# Patient Record
Sex: Male | Born: 1985 | Race: Black or African American | Hispanic: No | Marital: Single | Smoking: Current some day smoker
Health system: Southern US, Community
[De-identification: ages and names within clinical notes are randomized; demographics above are authoritative.]

---

## 2012-09-16 ENCOUNTER — Emergency Department (HOSPITAL_BASED_OUTPATIENT_CLINIC_OR_DEPARTMENT_OTHER): Payer: Self-pay

## 2012-09-16 ENCOUNTER — Encounter (HOSPITAL_BASED_OUTPATIENT_CLINIC_OR_DEPARTMENT_OTHER): Payer: Self-pay

## 2012-09-16 ENCOUNTER — Emergency Department (HOSPITAL_BASED_OUTPATIENT_CLINIC_OR_DEPARTMENT_OTHER)
Admission: EM | Admit: 2012-09-16 | Discharge: 2012-09-16 | Disposition: A | Discharge status: Home / Self Care | Payer: Self-pay | Attending: Emergency Medicine | Admitting: Emergency Medicine

## 2012-09-16 DIAGNOSIS — S82892A Other fracture of left lower leg, initial encounter for closed fracture: Secondary | ICD-10-CM

## 2012-09-16 DIAGNOSIS — Y92009 Unspecified place in unspecified non-institutional (private) residence as the place of occurrence of the external cause: Secondary | ICD-10-CM | POA: Insufficient documentation

## 2012-09-16 DIAGNOSIS — Y9339 Activity, other involving climbing, rappelling and jumping off: Secondary | ICD-10-CM | POA: Insufficient documentation

## 2012-09-16 DIAGNOSIS — S92009A Unspecified fracture of unspecified calcaneus, initial encounter for closed fracture: Secondary | ICD-10-CM | POA: Insufficient documentation

## 2012-09-16 DIAGNOSIS — S93409A Sprain of unspecified ligament of unspecified ankle, initial encounter: Secondary | ICD-10-CM | POA: Insufficient documentation

## 2012-09-16 DIAGNOSIS — S92109A Unspecified fracture of unspecified talus, initial encounter for closed fracture: Secondary | ICD-10-CM | POA: Insufficient documentation

## 2012-09-16 DIAGNOSIS — F17200 Nicotine dependence, unspecified, uncomplicated: Secondary | ICD-10-CM | POA: Insufficient documentation

## 2012-09-16 DIAGNOSIS — X500XXA Overexertion from strenuous movement or load, initial encounter: Secondary | ICD-10-CM | POA: Insufficient documentation

## 2012-09-16 MED ORDER — HYDROCODONE-ACETAMINOPHEN 5-325 MG PO TABS: 2.0000 | ORAL_TABLET | ORAL | Status: DC | PRN

## 2012-09-16 MED ORDER — IBUPROFEN 800 MG PO TABS: 800.0000 mg | ORAL_TABLET | Freq: Three times a day (TID) | ORAL | Status: DC

## 2012-09-16 NOTE — ED Provider Notes (Signed)
History    CSN: [id number] Arrival date & time 09/16/12  0818  First MD Initiated Contact with Patient 09/16/12 [phone]     Chief Complaint  Patient presents with  . Ankle Injury   (Consider location/radiation/quality/duration/timing/severity/associated sxs/prior Treatment) HPI Comments: Patient presents with left ankle pain. He states he locked himself out of the house and was climbing in the window and when he stepped down through the window he twisted his left foot. He complains of pain primarily in the lateral aspect of the left foot. He denies any injuries. He denies any past significant injuries to his foot. He has constant throbbing pain which has been worsening through the night. He has not taken anything for the pain at home.  Patient is a 27 y.o. male presenting with lower extremity injury.  Ankle Injury Pertinent negatives include no headaches.   History reviewed. No pertinent past medical history. History reviewed. No pertinent past surgical history. No family history on file. History  Substance Use Topics  . Smoking status: Current Some Day Smoker    Types: Cigarettes  . Smokeless tobacco: Not on file  . Alcohol Use: Yes    Review of Systems  Constitutional: Negative for fever.  HENT: Negative for neck pain.   Respiratory: Negative.   Cardiovascular: Negative.   Gastrointestinal: Negative for nausea and vomiting.  Musculoskeletal: Positive for joint swelling. Negative for back pain.  Skin: Negative for wound.  Neurological: Negative for headaches.    Allergies  Review of patient's allergies indicates no known allergies.  Home Medications   Current Outpatient Rx  Name  Route  Sig  Dispense  Refill  . HYDROcodone-acetaminophen (NORCO/VICODIN) 5-325 MG per tablet   Oral   Take 2 tablets by mouth every 4 (four) hours as needed for pain.   15 tablet   0   . ibuprofen (ADVIL,MOTRIN) 800 MG tablet   Oral   Take 1 tablet (800 mg total) by mouth 3 (three)  times daily.   21 tablet   0    BP 139/82  Pulse 71  Temp(Src) 98.4 F (36.9 C) (Oral)  Resp 16  Ht 6\' 1"  (1.854 m)  Wt 210 lb (95.255 kg)  BMI 27.71 kg/m2  SpO2 100% Physical Exam  Constitutional: He is oriented to person, place, and time. He appears well-developed and well-nourished.  HENT:  Head: Normocephalic and atraumatic.  Neck: Normal range of motion. Neck supple.  No pain to neck or back  Musculoskeletal: He exhibits edema and tenderness.  He has positive swelling over the fourth and fifth proximal metatarsals in the left foot. He has tenderness to these areas. He has no swelling over the lateral malleolus but he does have some mild tenderness to the medial malleolus. There is no pain to the proximal fibula. He has normal sensation and motor function in the foot. Pulses in the foot are intact.  Neurological: He is alert and oriented to person, place, and time.    ED Course  Procedures (including critical care time) No results found for this or any previous visit. Dg Ankle Complete Left  09/16/2012   *RADIOLOGY REPORT*  Clinical Data: Pain and swelling secondary to a twisting injury.  LEFT ANKLE COMPLETE - 3+ VIEW  Comparison: None.  Findings: There is a tiny avulsion of bone from the lateral aspect of the calcaneus.  There is overlying soft tissue swelling and there is also a small avulsion from the dorsal aspect of the distal talus.  No ankle  joint effusion.  IMPRESSION: Small avulsions from the dorsal distal talus and from the lateral aspect of the calcaneus.   Original Report Authenticated By: Francene Boyers, M.D.   Dg Foot Complete Left  09/16/2012   '*RADIOLOGY REPORT*  Clinical Data: Ankle injury, pain/swelling  LEFT FOOT - COMPLETE 3+ VIEW  Comparison: None.  Findings: Possible tiny osseous density along the lateral aspect of the cuboid/calcaneus.  Correlate for point tenderness to exclude a tiny avulsion fracture.  Otherwise, no fracture is seen.  The joint spaces are  preserved.  The visualized soft tissues are unremarkable.  IMPRESSION: Possible tiny osseous density along the lateral aspect of the cuboid/calcaneus.  Correlate for point tenderness to exclude a tiny avulsion fracture.   Original Report Authenticated By: Charline Bills, M.D.      1. Ankle sprain and strain, left, initial encounter   2. Avulsion fracture of ankle, left, closed, initial encounter     MDM  Patient is instructed in [place]. He was given a prescription for Vicodin and ibuprofen. He was given an ankle splint and crutches to use. He was given a referral to follow with Dr. Pearletha Forge.  Rolan Bucco, MD 09/16/12 [phone]

## 2012-09-16 NOTE — ED Notes (Signed)
Returned from xray

## 2012-09-16 NOTE — ED Notes (Signed)
Patient transported to X-ray 

## 2012-09-16 NOTE — ED Notes (Signed)
Patient reports that he twisted left ankle and foot after climbing through window last pm. Swelling noted to top of foot and lateral aspect of ankle. No deformity noted, positive distal pulses

## 2014-06-13 IMAGING — CR DG FOOT COMPLETE 3+V*L*
3 series · 3 of 3 positions shown · non-contrast
Comparison: None.

'*RADIOLOGY REPORT*
CLINICAL DATA: Ankle injury, pain/swelling

LEFT FOOT - COMPLETE 3+ VIEW

[t foot ap left]
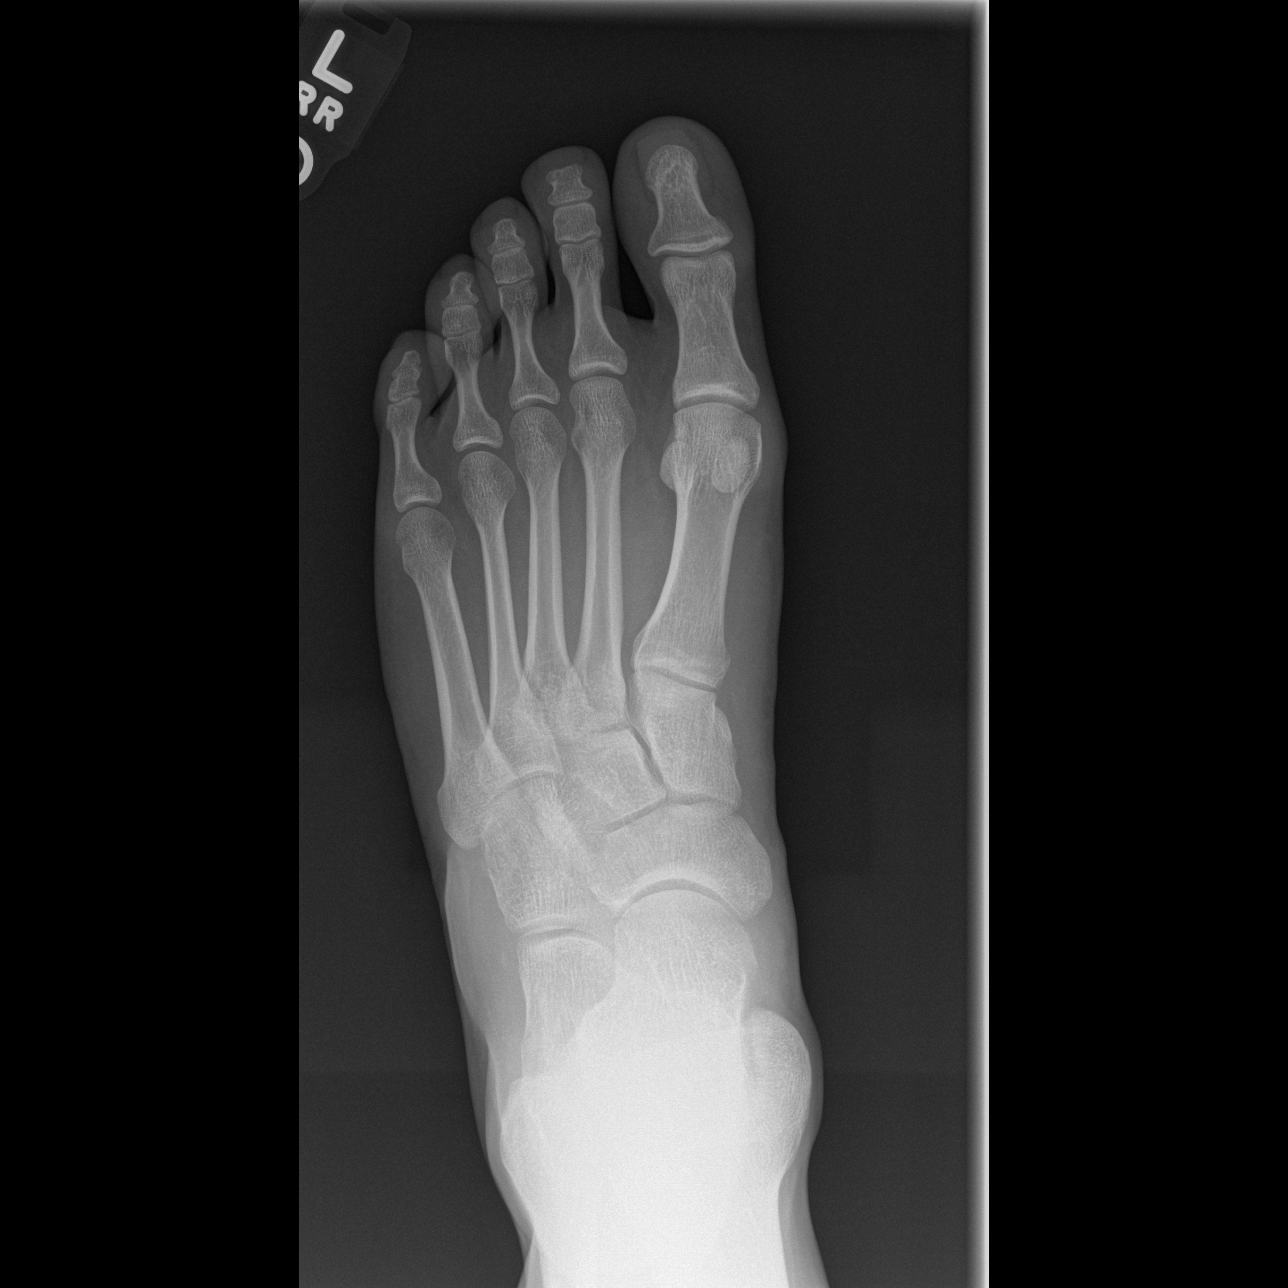

[t foot oblique left]
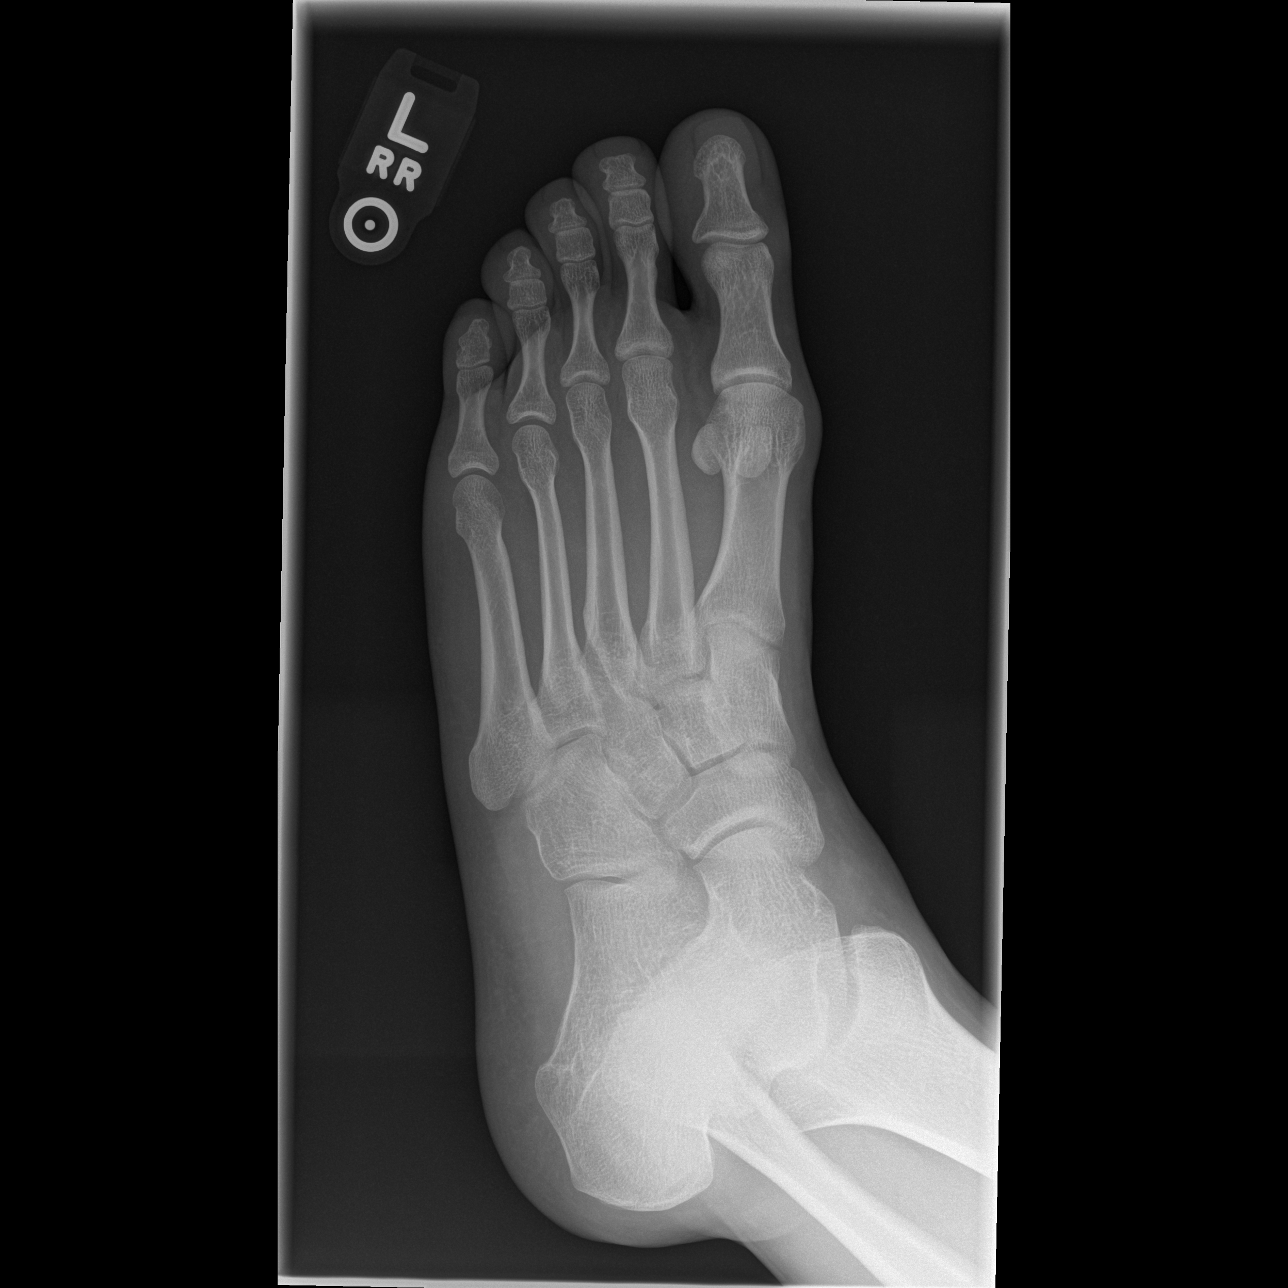

[t foot lat left]
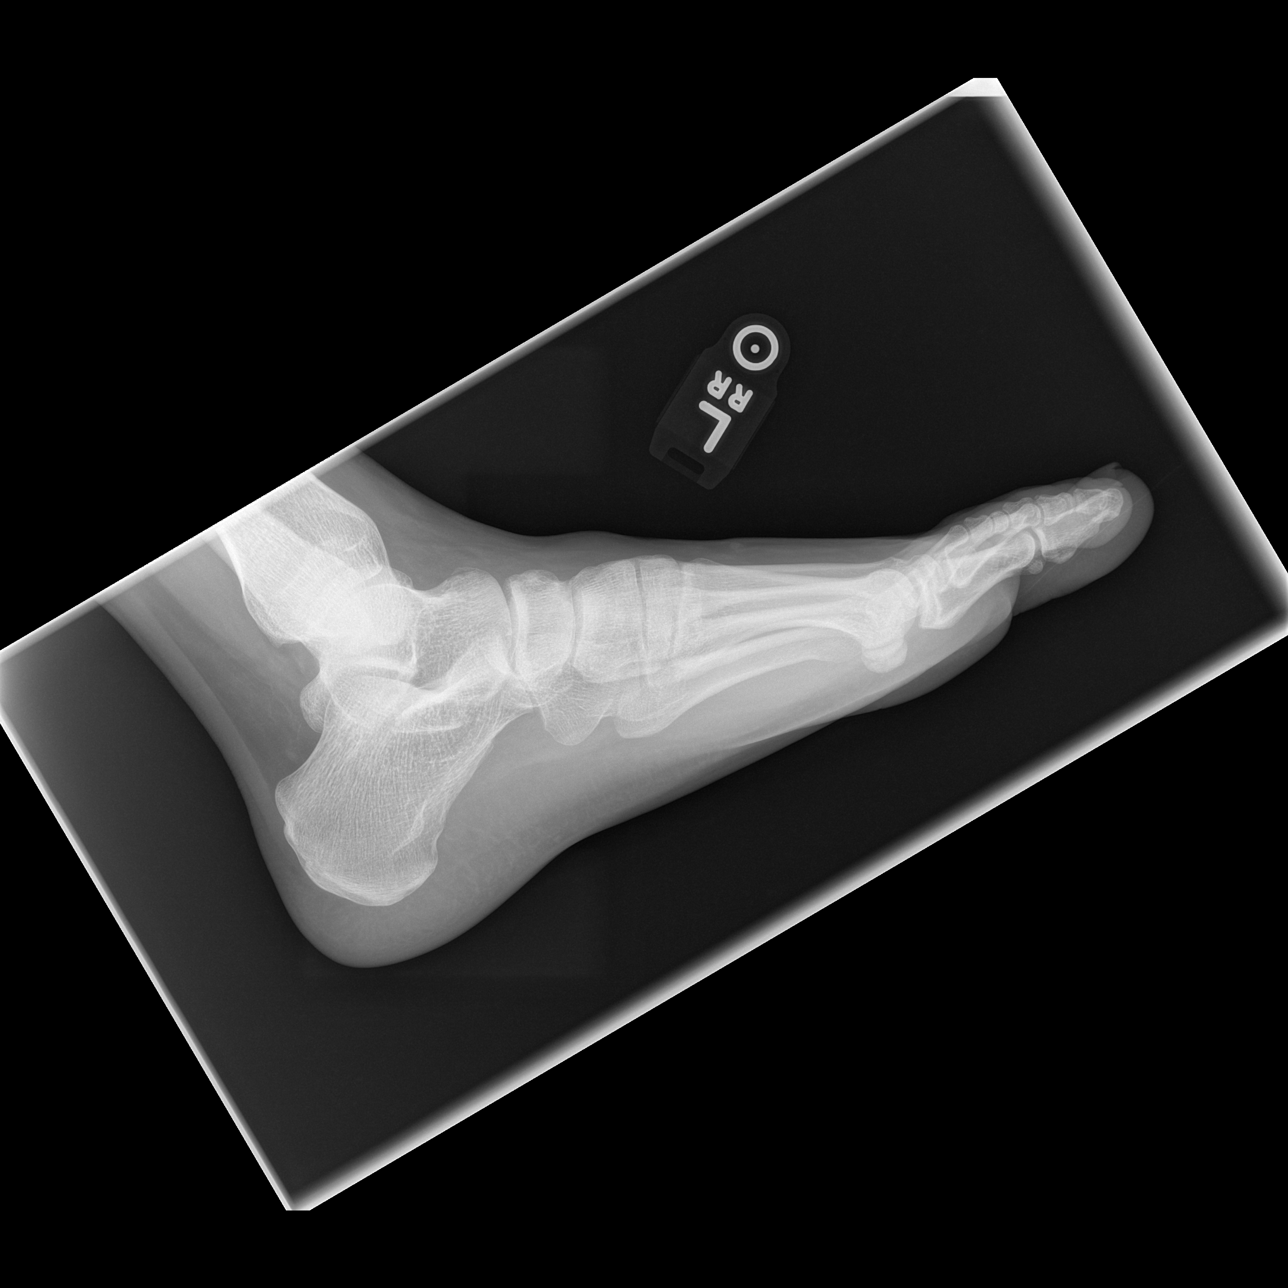

[3 of 3 positions shown; findings below may reference images not displayed]

FINDINGS: Possible tiny osseous density along the lateral aspect of
the cuboid/calcaneus.  Correlate for point tenderness to exclude a
tiny avulsion fracture.

Otherwise, no fracture is seen.

The joint spaces are preserved.

The visualized soft tissues are unremarkable.
IMPRESSION: Possible tiny osseous density along the lateral aspect of the
cuboid/calcaneus.  Correlate for point tenderness to exclude a tiny
avulsion fracture.

## 2015-01-29 ENCOUNTER — Encounter (HOSPITAL_COMMUNITY): Payer: Self-pay | Admitting: Emergency Medicine

## 2015-01-29 ENCOUNTER — Emergency Department (HOSPITAL_COMMUNITY)
Admission: EM | Admit: 2015-01-29 | Discharge: 2015-01-29 | Disposition: A | Discharge status: Home / Self Care | Payer: PRIVATE HEALTH INSURANCE | Attending: Emergency Medicine | Admitting: Emergency Medicine

## 2015-01-29 DIAGNOSIS — L03012 Cellulitis of left finger: Secondary | ICD-10-CM | POA: Insufficient documentation

## 2015-01-29 DIAGNOSIS — F17210 Nicotine dependence, cigarettes, uncomplicated: Secondary | ICD-10-CM | POA: Diagnosis not present

## 2015-01-29 DIAGNOSIS — R220 Localized swelling, mass and lump, head: Secondary | ICD-10-CM

## 2015-01-29 DIAGNOSIS — R600 Localized edema: Secondary | ICD-10-CM | POA: Insufficient documentation

## 2015-01-29 DIAGNOSIS — M79642 Pain in left hand: Secondary | ICD-10-CM | POA: Diagnosis present

## 2015-01-29 MED ORDER — CEPHALEXIN 500 MG PO CAPS: 500.0000 mg | ORAL_CAPSULE | Freq: Four times a day (QID) | ORAL | Status: AC

## 2015-01-29 MED ORDER — NAPROXEN 500 MG PO TABS: 500.0000 mg | ORAL_TABLET | Freq: Two times a day (BID) | ORAL | Status: AC

## 2015-01-29 MED ORDER — OXYCODONE-ACETAMINOPHEN 5-325 MG PO TABS
1.0000 | ORAL_TABLET | Freq: Once | ORAL | Status: AC
Start: 2015-01-29 — End: 2015-01-29
  Administered 2015-01-29: 1 via ORAL
  Filled 2015-01-29: qty 1

## 2015-01-29 NOTE — ED Notes (Signed)
Pt. presents with left distal thumb skin infection with pain and swelling onset last week , denies injury / no drainage .

## 2015-01-29 NOTE — ED Provider Notes (Signed)
CSN: [id number]     Arrival date & time 01/29/15  0008 History   First MD Initiated Contact with Patient 01/29/15 0047     Chief Complaint  Patient presents with  . Hand Pain     (Consider location/radiation/quality/duration/timing/severity/associated sxs/prior Treatment) Patient is a 29 y.o. male presenting with hand pain. The history is provided by the patient. No language interpreter was used.  Hand Pain This is a new problem. The current episode started in the past 7 days. The problem occurs constantly. The problem has been gradually worsening. Pertinent negatives include no fever. He has tried nothing for the symptoms.    History reviewed. No pertinent past medical history. History reviewed. No pertinent past surgical history. No family history on file. Social History  Substance Use Topics  . Smoking status: Current Some Day Smoker    Types: Cigarettes  . Smokeless tobacco: None  . Alcohol Use: Yes    Review of Systems  Constitutional: Negative for fever.  HENT: Positive for facial swelling.   Skin:       Swelling to cuticle left thumb  All other systems reviewed and are negative.     Allergies  Review of patient's allergies indicates no known allergies.  Home Medications   Prior to Admission medications   Not on File   BP 125/75 mmHg  Pulse 67  Temp(Src) 97.4 F (36.3 C) (Oral)  Resp 16  Ht 6\' 1"  (1.854 m)  Wt 190 lb (86.183 kg)  BMI 25.07 kg/m2  SpO2 97% Physical Exam  Constitutional: He is oriented to person, place, and time. He appears well-developed and well-nourished.  HENT:  Head: Normocephalic.  Area of induration without fluctuance left cheek  Eyes: Conjunctivae are normal.  Neck: Neck supple.  Cardiovascular: Normal rate and regular rhythm.   Pulmonary/Chest: Effort normal and breath sounds normal.  Abdominal: Soft.  Musculoskeletal: He exhibits tenderness. He exhibits no edema.       Hands: Paronychia dorsal, medial aspect left thumb    Lymphadenopathy:    He has no cervical adenopathy.  Neurological: He is alert and oriented to person, place, and time.  Skin: Skin is warm and dry.  Psychiatric: He has a normal mood and affect.  Nursing note and vitals reviewed.   ED Course  Procedures (including critical care time) INCISION AND DRAINAGE Performed by: Jimmye NormanSMITH,Timiya Howells JOHN Consent: Verbal consent obtained. Risks and benefits: risks, benefits and alternatives were discussed Type: paronychia  Body area: left thumb  Anesthesia: topical  Incision was made with an 18 ga needle  Anesthetic:pain-eaze  Anesthetic total: 10 second spray \ Complexity: simple  Drainage: purulent  Drainage amount: scant  Patient tolerance: Patient tolerated the procedure well with no immediate complications.  INCISION AND DRAINAGE Performed by: Jimmye NormanSMITH,Amardeep Beckers JOHN Consent: Verbal consent obtained. Risks and benefits: risks, benefits and alternatives were discussed Type: abscess  Body area: left side of face (cheek)  Anesthesia: topical  Needle aspiration  Local anesthetic: Pain-eaze  Anesthetic total: 10 second spray  Drainage: none  Patient tolerance: Patient tolerated the procedure well with no immediate complications.     Labs Review Labs Reviewed - No data to display  Imaging Review No results found. I have personally reviewed and evaluated these images and lab results as part of my medical decision-making.   EKG Interpretation None      MDM   Final diagnoses:  None    Paronychia left thumb. Facial swelling.  Care instructions provided. Keflex. Return precautions discussed.  Felicie Morn, NP 01/29/15 0144  Dione Booze, MD 01/29/15 0400

## 2015-01-29 NOTE — ED Notes (Signed)
Pt states concern that he needs to take drug test for probation coming up; pt received Percocet in the ED for pain control; Pt request note be placed for records if questioned about medication.

## 2015-01-29 NOTE — ED Notes (Signed)
Pt left insurance card in room; card giving to from desk if pt returns

## 2015-01-29 NOTE — Discharge Instructions (Signed)
Paronychia  Paronychia is an infection of the skin. It happens near a fingernail or toenail. It may cause pain and swelling around the nail. Usually, it is not serious and it clears up with treatment. HOME CARE  Soak the fingers or toes in warm water as told by your doctor. You may be told to do this for 20 minutes, 2-3 times a day.  Keep the area dry when you are not soaking it.  Take medicines only as told by your doctor.  If you were given an antibiotic medicine, finish all of it even if you start to feel better.  Keep the affected area clean.  Do not try to drain a fluid-filled bump yourself.  Wear rubber gloves when putting your hands in water.  Wear gloves if your hands might touch cleaners or chemicals.  Follow your doctor's instructions about:  Wound care.  Bandage (dressing) changes and removal. GET HELP IF:  Your symptoms get worse or do not improve.  You have a fever or chills.  You have redness spreading from the affected area.  You have more fluid, blood, or pus coming from the affected area.  Your finger or knuckle is swollen or is hard to move.   This information is not intended to replace advice given to you by your health care provider. Make sure you discuss any questions you have with your health care provider.   Document Released: 02/18/2009 Document Revised: 07/17/2014 Document Reviewed: 02/07/2014 Elsevier Interactive Patient Education 2016 Elsevier Inc. Abscess An abscess (boil or furuncle) is an infected area on or under the skin. This area is filled with yellowish-white fluid (pus) and other material (debris). HOME CARE   Only take medicines as told by your doctor.  If you were given antibiotic medicine, take it as directed. Finish the medicine even if you start to feel better.  If gauze is used, follow your doctor's directions for changing the gauze.  To avoid spreading the infection:  Keep your abscess covered with a bandage.  Wash  your hands well.  Do not share personal care items, towels, or whirlpools with others.  Avoid skin contact with others.  Keep your skin and clothes clean around the abscess.  Keep all doctor visits as told. GET HELP RIGHT AWAY IF:   You have more pain, puffiness (swelling), or redness in the wound site.  You have more fluid or blood coming from the wound site.  You have muscle aches, chills, or you feel sick.  You have a fever. MAKE SURE YOU:   Understand these instructions.  Will watch your condition.  Will get help right away if you are not doing well or get worse.   This information is not intended to replace advice given to you by your health care provider. Make sure you discuss any questions you have with your health care provider.   Document Released: 08/19/2007 Document Revised: 09/01/2011 Document Reviewed: 05/16/2011 Elsevier Interactive Patient Education [employer]2016 Elsevier Inc.

## 2015-04-23 ENCOUNTER — Ambulatory Visit (HOSPITAL_COMMUNITY): Payer: PRIVATE HEALTH INSURANCE

## 2015-05-06 ENCOUNTER — Emergency Department (HOSPITAL_COMMUNITY)
Admission: EM | Admit: 2015-05-06 | Discharge: 2015-05-06 | Disposition: A | Discharge status: Home / Self Care | Payer: PRIVATE HEALTH INSURANCE | Attending: Emergency Medicine | Admitting: Emergency Medicine

## 2015-05-06 ENCOUNTER — Encounter (HOSPITAL_COMMUNITY): Payer: Self-pay

## 2015-05-06 DIAGNOSIS — Z791 Long term (current) use of non-steroidal anti-inflammatories (NSAID): Secondary | ICD-10-CM | POA: Insufficient documentation

## 2015-05-06 DIAGNOSIS — F17210 Nicotine dependence, cigarettes, uncomplicated: Secondary | ICD-10-CM | POA: Insufficient documentation

## 2015-05-06 DIAGNOSIS — Z792 Long term (current) use of antibiotics: Secondary | ICD-10-CM | POA: Insufficient documentation

## 2015-05-06 DIAGNOSIS — Y999 Unspecified external cause status: Secondary | ICD-10-CM | POA: Insufficient documentation

## 2015-05-06 DIAGNOSIS — W503XXA Accidental bite by another person, initial encounter: Secondary | ICD-10-CM

## 2015-05-06 DIAGNOSIS — Y9289 Other specified places as the place of occurrence of the external cause: Secondary | ICD-10-CM | POA: Insufficient documentation

## 2015-05-06 DIAGNOSIS — Y9389 Activity, other specified: Secondary | ICD-10-CM | POA: Insufficient documentation

## 2015-05-06 DIAGNOSIS — S61210A Laceration without foreign body of right index finger without damage to nail, initial encounter: Secondary | ICD-10-CM | POA: Insufficient documentation

## 2015-05-06 DIAGNOSIS — S61259A Open bite of unspecified finger without damage to nail, initial encounter: Secondary | ICD-10-CM

## 2015-05-06 DIAGNOSIS — Z23 Encounter for immunization: Secondary | ICD-10-CM | POA: Insufficient documentation

## 2015-05-06 MED ORDER — AMOXICILLIN-POT CLAVULANATE 875-125 MG PO TABS: 1.0000 | ORAL_TABLET | Freq: Two times a day (BID) | ORAL | Status: DC

## 2015-05-06 MED ORDER — TETANUS-DIPHTH-ACELL PERTUSSIS 5-2.5-18.5 LF-MCG/0.5 IM SUSP
0.5000 mL | Freq: Once | INTRAMUSCULAR | Status: AC
  Administered 2015-05-06: 0.5 mL via INTRAMUSCULAR
  Filled 2015-05-06: qty 0.5
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
## 2015-05-06 NOTE — ED Notes (Signed)
Declined W/C at D/C and was escorted to lobby by RN. 
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
## 2015-05-06 NOTE — Discharge Instructions (Signed)
1. Medications: Please take all of your antibiotics until finished!, continue usual home medications 2. Treatment: rest, drink plenty of fluids, keep area clean and dry 3. Follow Up: Please follow up with your primary doctor in 3-5 days for discussion of your diagnoses and further evaluation after today's visit; if you do not have a primary care doctor use the resource guide provided to find one; Please return to the ER for new or worsening symptoms, any additional concerns.    Emergency Department Resource Guide  1) Find a Doctor and Pay Out of Pocket Although you won't have to find out who is covered by your insurance plan, it is a good idea to ask around and get recommendations. You will then need to call the office and see if the doctor you have chosen will accept you as a new patient and what types of options they offer for patients who are self-pay. Some doctors offer discounts or will set up payment plans for their patients who do not have insurance, but you will need to ask so you aren't surprised when you get to your appointment.  2) Contact Your Local Health Department Not all health departments have doctors that can see patients for sick visits, but many do, so it is worth a call to see if yours does. If you don't know where your local health department is, you can check in your phone book. The CDC also has a tool to help you locate your state's health department, and many state websites also have listings of all of their local health departments.  3) Find a Walk-in Clinic If your illness is not likely to be very severe or complicated, you may want to try a walk in clinic. These are popping up all over the country in pharmacies, drugstores, and shopping centers. They're usually staffed by nurse practitioners or physician assistants that have been trained to treat common illnesses and complaints. They're usually fairly quick and inexpensive. However, if you have serious medical issues or  chronic medical problems, these are probably not your best option.  No Primary Care Doctor: - Call Health Connect at  [phone]: they can help you locate a primary care doctor that  accepts your insurance, provides certain services, etc. - Physician Referral Service: [phone]  Chronic Pain Problems: Organization         Address  Phone   Notes  [facility]  [phone] Patients need to be referred by their primary care doctor.   Medication Assistance: Organization         Address  Phone   Notes  [facility] Medication [facility] [address] [place] [place], [place] [address] [phone] --Must be a resident of [facility] -- Must have NO insurance coverage whatsoever (no Medicaid/ Medicare, etc.) -- The pt. MUST have a primary care doctor that directs their care regularly and follows them in the community   [facility]  [phone]   [employer]  [phone]    Agencies that provide inexpensive medical care: Organization         Address  Phone   Notes  [facility]  [phone]   [facility]    [phone]   [facility] [address] [place], [place] [address] [phone]   [facility] [address] [place], [place] [phone]   [facility]    [phone]   [facility]    [phone]   [place] [address], [place] Phone:  [phone], Fax:  [phone] Hours of Operation:  9 am - 6 pm, M-F.  Also accepts Medicaid/Medicare and self-pay.  [facility] for [address], [address], [place] Phone: [phone], Fax: [phone]. Hours of Operation:  8:30 am - 5:30 pm, M-F.  Also accepts Medicaid and self-pay.  [facility] [address], [place] Phone: [phone]   [place], [place], [place]  [phone], Ext. [phone] Mondays & Thursdays: 7-9 AM.  First 15 patients are seen on a first come, first serve basis.    [facility] Providers:  Organization         Address  Phone   Notes  [facility] [address], [place] [phone] Also accepts self-pay patients.  [facility] [id number] [place], [place]  [phone]   [place], [address], [place] [phone]   [facility] [address] [place] [phone]   Lucianne Lei [address], [place]   [phone] Only accepts [place] patients after they have their name applied to their card.   Self-Pay (no insurance) in [facility]:  Organization         Address  Phone   Notes  Sickle Cell Patients, [facility] Internal [facility] [place] [phone]   [facility] [place] [phone]   [facility] [place]  [place], [place],  [phone]   [facility]/Dr. Osei-Bonsu  [address] [place] or [address], [place] [phone] Phone number for both [place] and [place] locations is the same.  [facility] and [facility] [address] [place] [phone]   [facility] [address], [place] or [address] [phone]   [facility] [address], [place] [phone], phone; [phone], fax Sees patients 1st and 3rd Saturday of every month.  Must not qualify for public or private insurance (i.e. Medicaid, Medicare, [place] Health Choice, Veterans' Benefits)  Household income should be no more than 200% of the poverty level The clinic cannot treat you if you are pregnant or think you are pregnant  Sexually transmitted  diseases are not treated at the clinic.

## 2017-02-26 ENCOUNTER — Other Ambulatory Visit: Payer: Self-pay

## 2017-02-26 ENCOUNTER — Encounter (HOSPITAL_BASED_OUTPATIENT_CLINIC_OR_DEPARTMENT_OTHER): Payer: Self-pay | Admitting: Emergency Medicine

## 2017-02-26 ENCOUNTER — Emergency Department (HOSPITAL_BASED_OUTPATIENT_CLINIC_OR_DEPARTMENT_OTHER)
Admission: EM | Admit: 2017-02-26 | Discharge: 2017-02-26 | Disposition: A | Discharge status: Home / Self Care | Payer: PRIVATE HEALTH INSURANCE | Attending: Emergency Medicine | Admitting: Emergency Medicine

## 2017-02-26 DIAGNOSIS — F17210 Nicotine dependence, cigarettes, uncomplicated: Secondary | ICD-10-CM | POA: Insufficient documentation

## 2017-02-26 DIAGNOSIS — J029 Acute pharyngitis, unspecified: Secondary | ICD-10-CM

## 2017-02-26 DIAGNOSIS — H6691 Otitis media, unspecified, right ear: Secondary | ICD-10-CM | POA: Insufficient documentation

## 2017-02-26 DIAGNOSIS — Z79899 Other long term (current) drug therapy: Secondary | ICD-10-CM | POA: Insufficient documentation

## 2017-02-26 LAB — RAPID STREP SCREEN (MED CTR MEBANE ONLY): STREPTOCOCCUS, GROUP A SCREEN (DIRECT): NEGATIVE

## 2017-02-26 MED ORDER — FLUTICASONE PROPIONATE 50 MCG/ACT NA SUSP: 2.0000 | Freq: Every day | NASAL | 0 refills | Status: AC

## 2017-02-26 MED ORDER — IBUPROFEN 800 MG PO TABS: 800.0000 mg | ORAL_TABLET | Freq: Three times a day (TID) | ORAL | 0 refills | Status: AC

## 2017-02-26 MED ORDER — AMOXICILLIN-POT CLAVULANATE 875-125 MG PO TABS
1.0000 | ORAL_TABLET | Freq: Once | ORAL | Status: AC
  Administered 2017-02-26: 1 via ORAL
  Filled 2017-02-26: qty 1

## 2017-02-26 MED ORDER — LIDOCAINE VISCOUS 2 % MT SOLN: 20.0000 mL | Freq: Four times a day (QID) | OROMUCOSAL | 0 refills | Status: AC | PRN

## 2017-02-26 MED ORDER — IBUPROFEN 800 MG PO TABS
800.0000 mg | ORAL_TABLET | Freq: Once | ORAL | Status: AC
  Administered 2017-02-26: 800 mg via ORAL
  Filled 2017-02-26: qty 1

## 2017-02-26 MED ORDER — AMOXICILLIN-POT CLAVULANATE 875-125 MG PO TABS: 1.0000 | ORAL_TABLET | Freq: Two times a day (BID) | ORAL | 0 refills | Status: AC

## 2017-02-26 MED ORDER — GI COCKTAIL ~~LOC~~
30.0000 mL | Freq: Once | ORAL | Status: AC
  Administered 2017-02-26: 30 mL via ORAL
  Filled 2017-02-26: qty 30

## 2017-02-26 NOTE — ED Provider Notes (Signed)
[facility] EMERGENCY DEPARTMENT Provider Note   CSN: [id number] Arrival date & time: 02/26/17  2009     History   Chief Complaint Chief Complaint  Patient presents with  . Sore Throat    HPI Gabriel CarinaDashawn Waldschmidt is a 31 y.o. male of the healthy here presenting with sore throat, right ear pain.  Patient has been having sore throat for the last week.  Patient states that he has tried over-the-counter throat lozenges and Mucinex with no relief.  States that the pain radiates to the right ear as well.  He has some chills but no fever.  Otherwise healthy.  Denies any sick contacts  The history is provided by the patient.    History reviewed. No pertinent past medical history.  There are no active problems to display for this patient.   History reviewed. No pertinent surgical history.     Home Medications    Prior to Admission medications   Medication Sig Start Date End Date Taking? Authorizing Provider  amoxicillin-clavulanate (AUGMENTIN) 875-125 MG tablet Take 1 tablet by mouth every 12 (twelve) hours. 05/06/15   Ward, Chase PicketJaime Pilcher, PA-C  cephALEXin (KEFLEX) 500 MG capsule Take 1 capsule (500 mg total) by mouth 4 (four) times daily. 01/29/15   Felicie MornSmith, Shondrika Hoque, NP  naproxen (NAPROSYN) 500 MG tablet Take 1 tablet (500 mg total) by mouth 2 (two) times daily. 01/29/15   Felicie MornSmith, Arzell Mcgeehan, NP    Family History No family history on file.  Social History Social History   Tobacco Use  . Smoking status: Current Some Day Smoker    Types: Cigarettes  Substance Use Topics  . Alcohol use: Yes  . Drug use: Not on file     Allergies   Patient has no known allergies.   Review of Systems Review of Systems  HENT: Positive for ear pain and sore throat.   All other systems reviewed and are negative.    Physical Exam Updated Vital Signs BP (!) 156/97 (BP Location: Left Arm)   Pulse 77   Temp 98.4 F (36.9 C) (Oral)   Resp 18   Ht 6\' 1"  (1.854 m)   Wt 99.8 kg (220 lb)    SpO2 100%   BMI 29.03 kg/m   Physical Exam  Constitutional: He appears well-developed.  HENT:  Head: Normocephalic.  Mouth/Throat: Mucous membranes are normal. Posterior oropharyngeal erythema present.  R TM bulging and slightly red, OP slightly red, no obvious tonsillar exudates. Uvula midline   Eyes: Pupils are equal, round, and reactive to light.  Neck: Normal range of motion.  R cervical LAD   Cardiovascular: Normal rate and normal heart sounds.  Pulmonary/Chest: Effort normal.  Abdominal: Soft.  Neurological: He is alert.  Skin: Skin is warm. Capillary refill takes less than 2 seconds.  Psychiatric: He has a normal mood and affect.  Nursing note and vitals reviewed.    ED Treatments / Results  Labs (all labs ordered are listed, but only abnormal results are displayed) Labs Reviewed  RAPID ST[facility] SCREEN [facility])  CULTURE, GROUP A STREP [facility]    EKG  EKG Interpretation None       Radiology No results found.  Procedures Procedures (including critical care time)  Medications Ordered in ED Medications  amoxicillin-clavulanate (AUGMENTIN) 875-125 MG per tablet 1 tablet (not administered)  ibuprofen (ADVIL,MOTRIN) tablet 800 mg (not administered)  gi cocktail (Maalox,Lidocaine,Donnatal) (not administered)     Initial Impression / Assessment and Plan / ED Course  I  have reviewed the triage vital signs and the nursing notes.  Pertinent labs & imaging results that were available during my care of the patient were reviewed by me and considered in my medical decision making (see chart for details).     Gabriel CarinaDashawn Larner is a 31 y.o. male here with sore throat, R ear pain. Rapid strep neg but he appears to have mild R otitis media. Will give augmentin. No evidence of PTA or RPA. He requests meds for sore throat, will try viscous lidocaine, motrin. Stable for discharge.    Final Clinical Impressions(s) / ED Diagnoses   Final diagnoses:  None    ED  Discharge Orders    None       Charlynne PanderYao, Shilo Pauwels Hsienta, MD 02/26/17 2223

## 2017-02-26 NOTE — Discharge Instructions (Signed)
Take motrin for pain.   Stay hydrated.   Take augmentin twice daily for a week.   Use viscous lidocaine as needed for sore throat.   Try flonase for congestion.   See your doctor  Return to ER if you have worse sore throat, trouble swallowing, fever, vomiting, dehydration

## 2017-02-26 NOTE — ED Triage Notes (Signed)
PT presents with c/o sore throat for a week and a half and right ear

## 2017-03-01 LAB — CULTURE, GROUP A STREP (THRC)
